# Patient Record
Sex: Male | Born: 1945 | Race: White | Hispanic: No | Marital: Married | Smoking: Never smoker
Health system: Southern US, Community
[De-identification: ages and names within clinical notes are randomized; demographics above are authoritative.]

## PROBLEM LIST (undated history)

## (undated) DIAGNOSIS — I10 Essential (primary) hypertension: Secondary | ICD-10-CM

## (undated) HISTORY — PX: CARDIAC SURGERY: SHX584

## (undated) HISTORY — PX: EYE SURGERY: SHX253

## (undated) HISTORY — PX: CATARACT EXTRACTION: SUR2

## (undated) HISTORY — DX: Essential (primary) hypertension: I10

---

## 2013-12-23 DIAGNOSIS — C44211 Basal cell carcinoma of skin of unspecified ear and external auricular canal: Secondary | ICD-10-CM | POA: Insufficient documentation

## 2015-04-11 DIAGNOSIS — Z951 Presence of aortocoronary bypass graft: Secondary | ICD-10-CM | POA: Insufficient documentation

## 2015-04-11 DIAGNOSIS — I10 Essential (primary) hypertension: Secondary | ICD-10-CM | POA: Insufficient documentation

## 2015-04-11 DIAGNOSIS — Z87891 Personal history of nicotine dependence: Secondary | ICD-10-CM | POA: Insufficient documentation

## 2015-04-11 DIAGNOSIS — E7800 Pure hypercholesterolemia, unspecified: Secondary | ICD-10-CM | POA: Insufficient documentation

## 2015-04-11 DIAGNOSIS — Z7982 Long term (current) use of aspirin: Secondary | ICD-10-CM | POA: Insufficient documentation

## 2015-04-11 DIAGNOSIS — I2510 Atherosclerotic heart disease of native coronary artery without angina pectoris: Secondary | ICD-10-CM | POA: Insufficient documentation

## 2015-10-19 DIAGNOSIS — Z1211 Encounter for screening for malignant neoplasm of colon: Secondary | ICD-10-CM | POA: Insufficient documentation

## 2016-11-17 DIAGNOSIS — G4733 Obstructive sleep apnea (adult) (pediatric): Secondary | ICD-10-CM | POA: Insufficient documentation

## 2018-12-08 ENCOUNTER — Other Ambulatory Visit: Payer: Self-pay | Admitting: Neurological Surgery

## 2018-12-08 DIAGNOSIS — R29898 Other symptoms and signs involving the musculoskeletal system: Secondary | ICD-10-CM

## 2018-12-20 ENCOUNTER — Other Ambulatory Visit: Payer: Self-pay | Admitting: Neurological Surgery

## 2018-12-20 DIAGNOSIS — T1590XA Foreign body on external eye, part unspecified, unspecified eye, initial encounter: Secondary | ICD-10-CM

## 2019-01-10 ENCOUNTER — Other Ambulatory Visit: Payer: Self-pay

## 2019-02-24 DIAGNOSIS — I25118 Atherosclerotic heart disease of native coronary artery with other forms of angina pectoris: Secondary | ICD-10-CM | POA: Insufficient documentation

## 2019-02-24 DIAGNOSIS — Z87891 Personal history of nicotine dependence: Secondary | ICD-10-CM | POA: Insufficient documentation

## 2019-02-24 DIAGNOSIS — R079 Chest pain, unspecified: Secondary | ICD-10-CM | POA: Insufficient documentation

## 2019-03-01 ENCOUNTER — Other Ambulatory Visit: Payer: Self-pay

## 2019-03-01 ENCOUNTER — Ambulatory Visit
Admission: RE | Admit: 2019-03-01 | Discharge: 2019-03-01 | Disposition: A | Discharge status: Home / Self Care | Payer: Medicare Other | Source: Ambulatory Visit | Attending: Neurological Surgery | Admitting: Neurological Surgery

## 2019-03-01 DIAGNOSIS — T1590XA Foreign body on external eye, part unspecified, unspecified eye, initial encounter: Secondary | ICD-10-CM

## 2019-03-01 DIAGNOSIS — R29898 Other symptoms and signs involving the musculoskeletal system: Secondary | ICD-10-CM

## 2019-03-28 ENCOUNTER — Other Ambulatory Visit: Payer: Self-pay | Admitting: Neurological Surgery

## 2019-06-16 ENCOUNTER — Other Ambulatory Visit: Payer: Self-pay

## 2019-06-16 DIAGNOSIS — Z20822 Contact with and (suspected) exposure to covid-19: Secondary | ICD-10-CM

## 2019-06-17 LAB — NOVEL CORONAVIRUS, NAA: SARS-CoV-2, NAA: DETECTED — AB

## 2019-06-20 ENCOUNTER — Emergency Department (HOSPITAL_COMMUNITY): Payer: Medicare Other

## 2019-06-20 ENCOUNTER — Emergency Department (HOSPITAL_COMMUNITY)
Admission: EM | Admit: 2019-06-20 | Discharge: 2019-06-20 | Disposition: A | Discharge status: Home / Self Care | Payer: Medicare Other | Attending: Emergency Medicine | Admitting: Emergency Medicine

## 2019-06-20 ENCOUNTER — Other Ambulatory Visit: Payer: Self-pay

## 2019-06-20 DIAGNOSIS — R251 Tremor, unspecified: Secondary | ICD-10-CM | POA: Diagnosis not present

## 2019-06-20 DIAGNOSIS — R2681 Unsteadiness on feet: Secondary | ICD-10-CM | POA: Insufficient documentation

## 2019-06-20 DIAGNOSIS — I2510 Atherosclerotic heart disease of native coronary artery without angina pectoris: Secondary | ICD-10-CM | POA: Diagnosis not present

## 2019-06-20 DIAGNOSIS — R5383 Other fatigue: Secondary | ICD-10-CM | POA: Diagnosis not present

## 2019-06-20 DIAGNOSIS — U071 COVID-19: Secondary | ICD-10-CM | POA: Insufficient documentation

## 2019-06-20 DIAGNOSIS — R531 Weakness: Secondary | ICD-10-CM | POA: Diagnosis present

## 2019-06-20 DIAGNOSIS — Z951 Presence of aortocoronary bypass graft: Secondary | ICD-10-CM | POA: Diagnosis not present

## 2019-06-20 DIAGNOSIS — R6889 Other general symptoms and signs: Secondary | ICD-10-CM | POA: Insufficient documentation

## 2019-06-20 DIAGNOSIS — Z79899 Other long term (current) drug therapy: Secondary | ICD-10-CM | POA: Insufficient documentation

## 2019-06-20 LAB — COMPREHENSIVE METABOLIC PANEL
ALT: 22 U/L (ref 0–44)
AST: 30 U/L (ref 15–41)
Albumin: 3.6 g/dL (ref 3.5–5.0)
Alkaline Phosphatase: 62 U/L (ref 38–126)
Anion gap: 11 (ref 5–15)
BUN: 11 mg/dL (ref 8–23)
CO2: 23 mmol/L (ref 22–32)
Calcium: 9.2 mg/dL (ref 8.9–10.3)
Chloride: 105 mmol/L (ref 98–111)
Creatinine, Ser: 0.93 mg/dL (ref 0.61–1.24)
GFR calc Af Amer: >60 mL/min (ref >60)
GFR calc non Af Amer: >60 mL/min (ref >60)
Glucose, Bld: 94 mg/dL (ref 70–99)
Potassium: 4.1 mmol/L (ref 3.5–5.1)
Sodium: 139 mmol/L (ref 135–145)
Total Bilirubin: 1 mg/dL (ref 0.3–1.2)
Total Protein: 6.4 g/dL — ABNORMAL LOW (ref 6.5–8.1)

## 2019-06-20 LAB — URINALYSIS, ROUTINE W REFLEX MICROSCOPIC
Bilirubin Urine: NEGATIVE
Glucose, UA: NEGATIVE mg/dL
Hgb urine dipstick: NEGATIVE
Ketones, ur: NEGATIVE mg/dL
Leukocytes,Ua: NEGATIVE
Nitrite: NEGATIVE
Protein, ur: NEGATIVE mg/dL
Specific Gravity, Urine: 1.01 (ref 1.005–1.030)
pH: 6 (ref 5.0–8.0)

## 2019-06-20 LAB — CBC
HCT: 36.8 % — ABNORMAL LOW (ref 39.0–52.0)
Hemoglobin: 12.3 g/dL — ABNORMAL LOW (ref 13.0–17.0)
MCH: 30.4 pg (ref 26.0–34.0)
MCHC: 33.4 g/dL (ref 30.0–36.0)
MCV: 91.1 fL (ref 80.0–100.0)
Platelets: 267 10*3/uL (ref 150–400)
RBC: 4.04 MIL/uL — ABNORMAL LOW (ref 4.22–5.81)
RDW: 12.1 % (ref 11.5–15.5)
WBC: 4.9 10*3/uL (ref 4.0–10.5)
nRBC: 0 % (ref 0.0–0.2)

## 2019-06-20 LAB — BASIC METABOLIC PANEL
Anion gap: 8 (ref 5–15)
BUN: 9 mg/dL (ref 8–23)
CO2: 25 mmol/L (ref 22–32)
Calcium: 9.1 mg/dL (ref 8.9–10.3)
Chloride: 105 mmol/L (ref 98–111)
Creatinine, Ser: 0.94 mg/dL (ref 0.61–1.24)
GFR calc Af Amer: >60 mL/min (ref >60)
GFR calc non Af Amer: >60 mL/min (ref >60)
Glucose, Bld: 91 mg/dL (ref 70–99)
Potassium: 4 mmol/L (ref 3.5–5.1)
Sodium: 138 mmol/L (ref 135–145)

## 2019-06-20 MED ORDER — SODIUM CHLORIDE 0.9% FLUSH: 3.0000 mL | Freq: Once | INTRAVENOUS | Status: DC

## 2019-06-20 NOTE — ED Provider Notes (Signed)
[facility] EMERGENCY DEPARTMENT Provider Note   CSN: [id number] Arrival date & time: 06/20/19  1348     History   Chief Complaint Chief Complaint  Patient presents with  . Weakness    HPI George Harvey is a 73 y.o. male with history of CAD with CABG, hypertension, hyperlipidemia, obstructive sleep apnea, diverticulosis presents today for 2-year history of fatigue, tremors, difficulty with ambulation and forgetfulness.  Of note patient reports that he tested positive for COVID-19 virus on 06/07/2019, unable to find this result in EMR system.  Patient has another positive COVID-19 test on 06/16/2019.  He reports he has been asymptomatic regarding this virus and he was only tested and screening prior to a primary care provider visit.  Patient reports that over the past few years he has been having generalized weakness, feeling tired, bilateral tremors worse in the upper extremities and increasing forgetfulness.  He denies any acute worsening of the symptoms he reports them all as progressive over the past 2 years.  He reports he has been evaluated multiple times by his primary care provider without identifiable cause, reports that this has been attributed to "getting older".  Patient and family at bedside are concerned for possible dementia.  They report that 2 weeks ago patient could not remember how to put on her shirt.  Additionally patient reports nausea with intermittent nonbloody/nonbilious vomiting over the past 2 years.  They deny history of fever/chills, headache/vision changes, fall/injury, neck pain, back pain, chest pain, abdominal pain, cough/shortness of breath, extremity swelling/color change, dysuria/hematuria or any additional concerns.    HPI  No past medical history on file.  There are no active problems to display for this patient.        Home Medications    Prior to Admission medications   Medication Sig Start Date End Date Taking? Authorizing  Provider  amLODipine (NORVASC) 5 MG tablet Take 5 mg by mouth daily. 04/06/19   [provider]  clopidogrel (PLAVIX) 75 MG tablet Take 75 mg by mouth daily. 04/06/19   [provider]  DULoxetine (CYMBALTA) 30 MG capsule Take 30 mg by mouth daily. 04/06/19   [provider]  lisinopril (ZESTRIL) 2.5 MG tablet Take 2.5 mg by mouth daily. 04/06/19   [provider]  metoprolol succinate (TOPROL-XL) 25 MG 24 hr tablet Take 25 mg by mouth daily. 04/06/19   [provider]  pantoprazole (PROTONIX) 40 MG tablet Take 40 mg by mouth daily. 06/14/19   [provider]  pravastatin (PRAVACHOL) 40 MG tablet Take 40 mg by mouth at bedtime. 05/30/19   [provider]    Family History No family history on file.  Social History Social History   Tobacco Use  . Smoking status: Not on file  Substance Use Topics  . Alcohol use: Not on file  . Drug use: Not on file     Allergies   Patient has no known allergies.   Review of Systems Review of Systems Ten systems are reviewed and are negative for acute change except as noted in the HPI   Physical Exam Updated Vital Signs BP 128/63   Pulse 65 Comment: Simultaneous filing. User may not have seen previous data.  Temp 97.9 F (36.6 C) (Oral)   Resp 19   Ht 5\' 7"  (1.702 m)   Wt 88.5 kg   SpO2 97% Comment: Simultaneous filing. User may not have seen previous data.  BMI 30.54 kg/m   Physical Exam  Constitutional:      General: He is not in acute distress.    Appearance: Normal appearance. He is well-developed. He is not ill-appearing or diaphoretic.  HENT:     Head: Normocephalic and atraumatic.     Right Ear: External ear normal.     Left Ear: External ear normal.     Nose: Nose normal.     Mouth/Throat:     Mouth: Mucous membranes are moist.     Pharynx: Oropharynx is clear.  Eyes:     General: Vision grossly intact. Gaze aligned appropriately.     Pupils: Pupils are equal, round, and  reactive to light.  Neck:     Musculoskeletal: Normal range of motion.     Trachea: Trachea and phonation normal. No tracheal deviation.  Cardiovascular:     Rate and Rhythm: Normal rate and regular rhythm.     Pulses: Normal pulses.     Heart sounds: Normal heart sounds.  Pulmonary:     Effort: Pulmonary effort is normal. No respiratory distress.  Abdominal:     General: There is no distension.     Palpations: Abdomen is soft.     Tenderness: There is no abdominal tenderness. There is no guarding or rebound.  Musculoskeletal: Normal range of motion.  Skin:    General: Skin is warm and dry.  Neurological:     Mental Status: He is alert.     GCS: GCS eye subscore is 4. GCS verbal subscore is 5. GCS motor subscore is 6.     Comments: Speech is clear and goal oriented, follows commands Major Cranial nerves without deficit, no facial droop Normal strength in upper and lower extremities bilaterally including dorsiflexion and plantar flexion, strong and equal grip strength Sensation normal to light and sharp touch Coordination intact, no gross ataxia, tremor noted in all extremities Normal finger to nose intact bilaterally No pronator drift  Psychiatric:        Behavior: Behavior normal.     ED Treatments / Results  Labs (all labs ordered are listed, but only abnormal results are displayed) Labs Reviewed  CBC - Abnormal; Notable for the following components:      Result Value   RBC 4.04 (*)    Hemoglobin 12.3 (*)    HCT 36.8 (*)    All other components within normal limits  COMPREHENSIVE METABOLIC PANEL - Abnormal; Notable for the following components:   Total Protein 6.4 (*)    All other components within normal limits  BASIC METABOLIC PANEL  URINALYSIS, ROUTINE W REFLEX MICROSCOPIC    EKG EKG Interpretation  Date/Time:  Monday June 20 2019 14:00:41 EDT Ventricular Rate:  73 PR Interval:  162 QRS Duration: 96 QT Interval:  396 QTC Calculation: 436 R Axis:    17 Text Interpretation:  Normal sinus rhythm Normal ECG No previous tracing Confirmed by Gwyneth SproutPlunkett, Whitney ([id number]) on 06/20/2019 5:42:10 PM   Radiology Ct Head Wo Contrast  Result Date: 06/20/2019 CLINICAL DATA:  Worsening chronic ataxia and generalized weakness. Extremity shaking. No reported injury. EXAM: CT HEAD WITHOUT CONTRAST TECHNIQUE: Contiguous axial images were obtained from the base of the skull through the vertex without intravenous contrast. COMPARISON:  03/01/2019 brain MRI. FINDINGS: Brain: Tiny bilateral basal ganglia lacunes. No evidence of parenchymal hemorrhage or extra-axial fluid collection. No mass lesion, mass effect, or midline shift. No CT evidence of acute infarction. Generalized cerebral volume loss. Nonspecific prominent subcortical and periventricular white matter hypodensity, most in keeping with chronic small  vessel ischemic change. Cerebral ventricle sizes are concordant with the degree of cerebral volume loss. Vascular: No acute abnormality. Skull: No evidence of calvarial fracture. Sinuses/Orbits: Mucous retention cysts versus polyps in the medial maxillary sinuses bilaterally. No air-fluid levels. Atrophic calcified right globe. Other:  The mastoid air cells are unopacified. IMPRESSION: 1.  No evidence of acute intracranial abnormality. 2. Generalized cerebral volume loss and prominent chronic small vessel ischemic changes in cerebral white matter. Tiny bilateral basal ganglia lacunes. Electronically Signed   By: Delbert Phenix M.D.   On: 06/20/2019 20:41   Dg Chest Portable 1 View  Result Date: 06/20/2019 CLINICAL DATA:  Fatigue. COVID-19 positive. Coronary artery disease. EXAM: PORTABLE CHEST 1 VIEW COMPARISON:  03/18/2019 FINDINGS: The heart size and mediastinal contours are within normal limits. Prior CABG again noted. Both lungs are clear. The visualized skeletal structures are unremarkable. IMPRESSION: No active disease. Electronically Signed   By: Danae Orleans M.D.    On: 06/20/2019 18:10    Procedures Procedures (including critical care time)  Medications Ordered in ED Medications  sodium chloride flush (NS) 0.9 % injection 3 mL (has no administration in time range)     Initial Impression / Assessment and Plan / ED Course  I have reviewed the triage vital signs and the nursing notes.  Pertinent labs & imaging results that were available during my care of the patient were reviewed by me and considered in my medical decision making (see chart for details).    73 year old male with gradual worsening of fatigue, tremor, gait instability and forgetfulness over the past 2 years.  Appears somewhat worsened since his diagnosis of COVID-19 virus 2 weeks ago.  On initial evaluation he is overall well-appearing, resting comfortably and in no acute distress.  Heart regular rate and rhythm without murmur, lungs clear to auscultation bilaterally, abdomen soft nontender without peritoneal signs, Norvasc intact to all 4 extremities without sign of DVT, neuro exam without focal deficit he does have tremor present in all 4 extremities.  Discussed case with Dr. Tanna Savoy will obtain basic blood work/urinalysis, chest x-ray, EKG and CT head. - CBC nonacute BMP within normal limits CMP nonacute Urinalysis within normal limits Chest x-ray:  IMPRESSION:  No active disease.  - On reevaluation patient resting comfortably and in no acute distress.  Patient and family member are agreeable to CT scan and work-up today. - EKG: Normal sinus rhythm Normal ECG No previous tracing Confirmed by Gwyneth Sprout ([id number]) on 06/20/2019 5:42:10 PM  CT Head:  IMPRESSION:  1. No evidence of acute intracranial abnormality.  2. Generalized cerebral volume loss and prominent chronic small  vessel ischemic changes in cerebral white matter. Tiny bilateral  basal ganglia lacunes.  - Patient seen and evaluated by Dr. Tanna Savoy, advises no further work-up here in the ED patient to be  given ambulatory referral to outpatient neurology for further work-up. - Patient reassessed lying comfortably in bed no acute distress, states understanding care plan is agreeable to discharge with neurology referral.  At this time there does not appear to be any evidence of an acute emergency medical condition and the patient appears stable for discharge with appropriate outpatient follow up. Diagnosis was discussed with patient and family who are understanding of care plan and is agreeable to discharge.  Return precautions given to patient and family member who state understanding of return precautions. Patient encouraged to follow-up with their PCP and neurologist. All questions answered.  Adolphus Birchwood was evaluated in Emergency Department on  06/20/2019 for the symptoms described in the history of present illness. He was evaluated in the context of the global COVID-19 pandemic, which necessitated consideration that the patient might be at risk for infection with the SARS-CoV-2 virus that causes COVID-19. Institutional protocols and algorithms that pertain to the evaluation of patients at risk for COVID-19 are in a state of rapid change based on information released by regulatory bodies including the CDC and federal and state organizations. These policies and algorithms were followed during the patient's care in the ED.  Note: Portions of this report may have been transcribed using voice recognition software. Every effort was made to ensure accuracy; however, inadvertent computerized transcription errors may still be present. Final Clinical Impressions(s) / ED Diagnoses   Final diagnoses:  Tremor, unspecified  Forgetfulness  Fatigue, unspecified type  Gait instability  COVID-19 virus infection    ED Discharge Orders         Ordered    Ambulatory referral to Neurology    Comments: An appointment is requested in approximately: 1 week   06/20/19 2119           Elizabeth PalauMorelli, Kellie Murrill A, PA-C  06/20/19 2137    Gwyneth SproutPlunkett, Whitney, MD 06/20/19 2244

## 2019-06-20 NOTE — ED Notes (Signed)
Attempted to obtain urine specimen; Pt unable to provide one at this time 

## 2019-06-20 NOTE — ED Triage Notes (Signed)
Pt reports generalized weakness and balance issues X2 years. Also c/o shakiness in all extremities. Seen by PCP for the same with no answers.

## 2019-06-20 NOTE — Discharge Instructions (Addendum)
You have been diagnosed today with gait instability, fatigue, forgetfulness, tremor.  At this time there does not appear to be the presence of an emergent medical condition, however there is always the potential for conditions to change. Please read and follow the below instructions.  Please return to the Emergency Department immediately for any new or worsening symptoms. Please be sure to follow up with your Primary Care Provider within one week regarding your visit today; please call their office to schedule an appointment even if you are feeling better for a follow-up visit. You should be contacted within the next several days to schedule an appointment with the [facility] neurology office for further evaluation of your symptoms.  If you do not hear from their office within the next few days you may call the number on your discharge paperwork to schedule this appointment. Please be sure to use your walker at home to avoid falls. Please continue your self quarantine to avoid spread of the COVID-19 virus.  Follow-up with a primary care provider this week for tele-visit and reevaluation.  Return to emerge department immediately for any new or worsening symptoms.  Get help right away if: You feel confused. You fall or suffer any type of injury You have chest pain or difficulty breathing Your vision is blurry. You feel faint or you pass out. You have a severe headache. You have severe pain in your abdomen, your back, or the area between your waist and hips (pelvis). You have chest pain, shortness of breath, or an irregular or fast heartbeat. You are unable to urinate, or you urinate less than normal. You have abnormal bleeding, such as bleeding from the rectum, vagina, nose, lungs, or nipples. You vomit. You have any new/concerning or worsening symptoms.  Please read the additional information packets attached to your discharge summary.  Do not take your medicine if  develop an itchy rash,  swelling in your mouth or lips, or difficulty breathing; call 911 and seek immediate emergency medical attention if this occurs.

## 2019-06-30 ENCOUNTER — Encounter: Payer: Self-pay | Admitting: Neurology

## 2019-07-25 NOTE — Progress Notes (Addendum)
NEUROLOGY CONSULTATION NOTE  George Harvey MRN: [id number] DOB: September 24, 1946  Referring provider: Nuala Alpha, PA-C (ED referral) Primary care provider: Fredrich Romans, MD  Reason for consult:  Drop attacks, ataxia.  HISTORY OF PRESENT ILLNESS: George Harvey is a 73 year old male who presents for weakness and drop attacks.  He is accompanied by his daughter who supplements history.  He started having trouble with falls about 49 to 73 years old.  It has been occurring off and on since, but has progressed.  At first, his legs would just suddenly give out.  It takes him a few minutes to stand up and when he stands up his upper body is extended back to maintain balance and resolves in 15 to 30 minutes.  No loss of consciousness.  He has had about 3 drop attacks this past year.  He also reports balance issues that has progressed.  He veers off to either side. He also reports longstanding history of dizziness or lightheadedness associated with nausea.  They can last 1 to 2 days.  No associated headache.  They occur once a month.  Rooms with high ceilings or bright lights (such as at [facility]) will make him feel dizzy.  Since childhood, he has always been to movement (such as riding a carnival ride or sitting in back of car).  No history of headaches.  He may have intermittent double vision. He presented to the ED in September with episode of acute tremors lasting 2 hours which subsequently resolved.  No recurrent episodes.  He reports history of very mild tremor in his hands, particularly when holding an object.  MRI of brain without contrast from 03/01/2019 personally showed atrophy and diffuse chronic small vessel ischemic changes in the cerebral white matter as well as scattered remote lacunar infarcts, including the cerebellum.  MRI of cervical spine from 06/28/2019 showed multilevel cervical spondylosis with severe bilateral neural foraminal stenosis at C5-6 and C6-7 with no significant spinal  stenosis.  MRI lumbar spine showed mild multilevel lumbar spondylosis with mild canal stenosis and moderate left and mild right lateral recess stenosis at L3-4 and L4-5.  LABS:  WBC 5.9, HGB 14.1, HCT 40.3, PLT 272, Na 141, K 4.4, BUN 11, Cr 1.01, GFR 72, glucose 94, Hgb A1c 5.7, ALT 15.  PAST MEDICAL HISTORY: Past Medical History:  Diagnosis Date  . Hypertension     PAST SURGICAL HISTORY: Past Surgical History:  Procedure Laterality Date  . CARDIAC SURGERY     stents place  . CATARACT EXTRACTION    . EYE SURGERY Right      MEDICATIONS: Current Outpatient Medications on File Prior to Visit  Medication Sig Dispense Refill  . amLODipine (NORVASC) 5 MG tablet Take 5 mg by mouth daily.    . clopidogrel (PLAVIX) 75 MG tablet Take 75 mg by mouth daily.    . DULoxetine (CYMBALTA) 30 MG capsule Take 30 mg by mouth daily.    Marland Kitchen lisinopril (ZESTRIL) 2.5 MG tablet Take 2.5 mg by mouth daily.    . metoprolol succinate (TOPROL-XL) 25 MG 24 hr tablet Take 25 mg by mouth daily.    . pantoprazole (PROTONIX) 40 MG tablet Take 40 mg by mouth daily.    . pravastatin (PRAVACHOL) 40 MG tablet Take 40 mg by mouth at bedtime.     No current facility-administered medications on file prior to visit.     ALLERGIES: No Known Allergies  FAMILY HISTORY: Family History  Problem Relation Age of Onset  .  Heart Problems Mother   . Heart attack Father   . Heart attack Brother        deceased  . Stroke Brother   . Heart Problems Brother   . Hypertension Child        x2  . Healthy Child        x2    SOCIAL HISTORY: Social History   Socioeconomic History  . Marital status: Married    Spouse name: Not on file  . Number of children: Not on file  . Years of education: Not on file  . Highest education level: Not on file  Occupational History  . Not on file  Social Needs  . Financial resource strain: Not on file  . Food insecurity    Worry: Not on file    Inability: Not on file  .  Transportation needs    Medical: Not on file    Non-medical: Not on file  Tobacco Use  . Smoking status: Not on file  Substance and Sexual Activity  . Alcohol use: Not on file  . Drug use: Not on file  . Sexual activity: Not on file  Lifestyle  . Physical activity    Days per week: Not on file    Minutes per session: Not on file  . Stress: Not on file  Relationships  . Social Musician on phone: Not on file    Gets together: Not on file    Attends religious service: Not on file    Active member of club or organization: Not on file    Attends meetings of clubs or organizations: Not on file    Relationship status: Not on file  . Intimate partner violence    Fear of current or ex partner: Not on file    Emotionally abused: Not on file    Physically abused: Not on file    Forced sexual activity: Not on file  Other Topics Concern  . Not on file  Social History Narrative  . Not on file    REVIEW OF SYSTEMS: Constitutional: No fevers, chills, or sweats, no generalized fatigue, change in appetite Eyes: vision loss in right eye Ear, nose and throat: No hearing loss, ear pain, nasal congestion, sore throat Cardiovascular: No chest pain, palpitations Respiratory:  No shortness of breath at rest or with exertion, wheezes GastrointestinaI: No nausea, vomiting, diarrhea, abdominal pain, fecal incontinence Genitourinary:  No dysuria, urinary retention or frequency Musculoskeletal:  No neck pain, back pain Integumentary: No rash, pruritus, skin lesions Neurological: as above Psychiatric: No depression, insomnia, anxiety Endocrine: No palpitations, fatigue, diaphoresis, mood swings, change in appetite, change in weight, increased thirst Hematologic/Lymphatic:  No purpura, petechiae. Allergic/Immunologic: no itchy/runny eyes, nasal congestion, recent allergic reactions, rashes  PHYSICAL EXAM: Blood pressure 124/77, pulse 85, height 5\' 7"  (1.702 m), weight 179 lb 9.6 oz  (81.5 kg), SpO2 94 %. General: No acute distress.  Patient appears well-groomed.   Head:  Normocephalic/atraumatic Eyes:  fundi examined but not visualized Neck: supple, no paraspinal tenderness, full range of motion Back: No paraspinal tenderness Heart: regular rate and rhythm Lungs: Clear to auscultation bilaterally. Vascular: No carotid bruits. Neurological Exam: Mental status: alert and oriented to person, place, and time, recent and remote memory intact, fund of knowledge intact, attention and concentration intact, speech fluent and not dysarthric, language intact. Cranial nerves: CN I: not tested CN II: Scarring of right eye; left pupil round and reactive to light, Vision loss in right  eye. CN III, IV, VI:  full range of motion, no nystagmus, no ptosis CN V: facial sensation intact CN VII: upper and lower face symmetric CN VIII: hearing intact CN IX, X: gag intact, uvula midline CN XI: sternocleidomastoid and trapezius muscles intact CN XII: tongue midline Bulk & Tone: normal, no fasciculations. Motor:  5/5 throughout  Sensation:  Pinprick sensation intact; vibration sensation reduced in toes. Deep Tendon Reflexes:  2+ throughout, toes downgoing.   Finger to nose testing:  Without dysmetria.   Heel to shin:  Without dysmetria.   Gait:  Mildly wide-based gait.  Able to turn and tandem walk.  Romberg negative.  IMPRESSION: 1.  Drop attacks 2.  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  Disequilibrium, chronic 4.  Cerebrovascular disease. 5.  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  Usually mild.  I am uncertain why he had a severe episode of ongoing tremors.  Disequilibrium may be multifactorial.  He has longstanding history of motion sickness since childhood.  He has cerebrovascular disease, including involvement of the cerebellum, which may be playing a role in balance difficulty.  Cerebrovascular disease may be affecting cognition as well.  Unclear etiology of drop  attacks but I would like to rule out vertebrobasilar insufficiency and epileptic drop attacks.    PLAN: 1.  To further evaluate drop attacks, will check CTA of head (to evaluate for vertebrobasilar insufficiency) and EEG (to evaluate for epileptic etiology) 2.  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 3.  Follow up in 4 months.  Thank you for allowing me to take part in the care of this patient.  Shon MilletAdam Jaffe, DO  CC:  Malachi ParadiseHarry Alexander, MD

## 2019-07-28 ENCOUNTER — Other Ambulatory Visit: Payer: Self-pay

## 2019-07-28 ENCOUNTER — Ambulatory Visit: Payer: Medicare Other | Admitting: Neurology

## 2019-07-28 ENCOUNTER — Encounter: Payer: Self-pay | Admitting: Neurology

## 2019-07-28 VITALS — BP 124/77 | HR 85 | Ht 67.0 in | Wt 179.6 lb

## 2019-07-28 DIAGNOSIS — G43809 Other migraine, not intractable, without status migrainosus: Secondary | ICD-10-CM | POA: Diagnosis not present

## 2019-07-28 DIAGNOSIS — R55 Syncope and collapse: Secondary | ICD-10-CM | POA: Diagnosis not present

## 2019-07-28 DIAGNOSIS — E878 Other disorders of electrolyte and fluid balance, not elsewhere classified: Secondary | ICD-10-CM

## 2019-07-28 DIAGNOSIS — I679 Cerebrovascular disease, unspecified: Secondary | ICD-10-CM

## 2019-07-28 MED ORDER — AIMOVIG 70 MG/ML ~~LOC~~ SOAJ: 70.0000 mg | SUBCUTANEOUS | 0 refills | Status: DC

## 2019-07-28 MED ORDER — AIMOVIG 70 MG/ML ~~LOC~~ SOAJ: 70.0000 mg | SUBCUTANEOUS | 11 refills | Status: DC

## 2019-07-28 NOTE — Patient Instructions (Signed)
1.  Start Aimovig 70mg  injection every 30 days 2.  Check CTA of head 3.  Check EEG 4.  Follow up in 4 months

## 2019-07-29 ENCOUNTER — Encounter: Payer: Self-pay | Admitting: *Deleted

## 2019-07-29 NOTE — Progress Notes (Signed)
George Harvey (Key: [id number])  Your information has been submitted to [place]. [employer] [place] will review the request and notify you of the determination decision directly, typically within 3 business days of your submission and once all necessary information is received.  You will also receive your request decision electronically. To check for an update later, open the request again from your dashboard.  If [employer] [place] has not responded within the specified timeframe or if you have any questions about your PA submission, contact [facility] [place] directly at [facility]) [phone] or ([place]) [phone].

## 2019-07-29 NOTE — Progress Notes (Signed)
Tahje Elison (Key: [id number]) Rx #: [id number] Aimovig 70MG /ML auto-injectors   Form [employer] [place] [employer] Part D General Authorization Form Created 4 hours ago Sent to Plan 1 hour ago Plan Response 1 hour ago Submit Clinical Questions 1 hour ago Determination Favorable 1 minute ago Message from Plan Effective from 07/29/2019 through 07/28/2020.

## 2019-08-01 ENCOUNTER — Other Ambulatory Visit: Payer: Self-pay

## 2019-08-01 ENCOUNTER — Ambulatory Visit (INDEPENDENT_AMBULATORY_CARE_PROVIDER_SITE_OTHER): Payer: Medicare Other | Admitting: Neurology

## 2019-08-01 DIAGNOSIS — G43809 Other migraine, not intractable, without status migrainosus: Secondary | ICD-10-CM

## 2019-08-01 DIAGNOSIS — E878 Other disorders of electrolyte and fluid balance, not elsewhere classified: Secondary | ICD-10-CM

## 2019-08-01 DIAGNOSIS — R55 Syncope and collapse: Secondary | ICD-10-CM | POA: Diagnosis not present

## 2019-08-01 DIAGNOSIS — I679 Cerebrovascular disease, unspecified: Secondary | ICD-10-CM

## 2019-08-02 ENCOUNTER — Telehealth: Payer: Self-pay

## 2019-08-02 NOTE — Telephone Encounter (Signed)
Called spoke with patient he was informed of results. 

## 2019-08-02 NOTE — Procedures (Signed)
ELECTROENCEPHALOGRAM REPORT  Date of Study: 08/01/2019  Patient's Name: George Harvey MRN: [id number] Date of Birth: 1946/04/09  Referring Provider: Metta Clines, DO  Clinical History: 73 year old male with disequilibrium, episodic dizziness and drop attacks.  Medications: NORVASC 5 MG tablet PLAVIX 75 MG tablet CYMBALTA 30 MG capsule ZESTRIL 2.5 MG tablet TOPROL-XL 25 MG 24 hr tablet PROTONIX 40 MG tablet PRAVACHOL 40 MG tablet  Technical Summary: A multichannel digital EEG recording measured by the international 10-20 system with electrodes applied with paste and impedances below 5000 ohms performed in our laboratory with EKG monitoring in an awake and drowsy patient.  Hyperventilation not performed as patient wearing mask due to COVID-19 pandemic.  Photic stimulation was performed.  The digital EEG was referentially recorded, reformatted, and digitally filtered in a variety of bipolar and referential montages for optimal display.    Description: The patient is awake and drowsy during the recording.  During maximal wakefulness, there is a symmetric, medium voltage 10 Hz posterior dominant rhythm that attenuates with eye opening.  The record is symmetric.  There is extensive muscle artifact throughout the study.  During drowsiness, there is an increase in theta slowing of the background.  Stage 2 sleep was not seen.  Photic stimulation did not elicit any abnormalities.  There were no epileptiform discharges or electrographic seizures seen.    EKG lead was unremarkable.  Impression: This awake and drowsy EEG is somewhat limited due to muscle artifact.  However, no epileptiform discharges were seen.  Overall, study is within normal limits..    Clinical Correlation: A normal EEG does not exclude a clinical diagnosis of epilepsy.  If further clinical questions remain, prolonged EEG may be helpful.  Clinical correlation is advised.   Metta Clines, DO

## 2019-08-02 NOTE — Telephone Encounter (Signed)
-----   Message from Pieter Partridge, DO sent at 08/02/2019  8:00 AM EDT ----- EEG is normal

## 2019-08-31 ENCOUNTER — Ambulatory Visit
Admission: RE | Admit: 2019-08-31 | Discharge: 2019-08-31 | Disposition: A | Discharge status: Home / Self Care | Payer: Medicare Other | Source: Ambulatory Visit | Attending: Neurology | Admitting: Neurology

## 2019-08-31 DIAGNOSIS — R55 Syncope and collapse: Secondary | ICD-10-CM

## 2019-08-31 DIAGNOSIS — G43809 Other migraine, not intractable, without status migrainosus: Secondary | ICD-10-CM

## 2019-08-31 DIAGNOSIS — E878 Other disorders of electrolyte and fluid balance, not elsewhere classified: Secondary | ICD-10-CM

## 2019-08-31 DIAGNOSIS — I679 Cerebrovascular disease, unspecified: Secondary | ICD-10-CM

## 2019-08-31 MED ORDER — IOPAMIDOL (ISOVUE-370) INJECTION 76%
75.0000 mL | Freq: Once | INTRAVENOUS | Status: AC | PRN
  Administered 2019-08-31: 75 mL via INTRAVENOUS

## 2019-09-05 ENCOUNTER — Telehealth: Payer: Self-pay

## 2019-09-05 NOTE — Telephone Encounter (Signed)
Called spoke with patient he was informed of results and understands provider response.

## 2019-09-05 NOTE — Telephone Encounter (Signed)
-----   Message from Pieter Partridge, DO sent at 09/05/2019  9:04 AM EST ----- Blood vessels in head show plaque build up in the right vertebral artery however the other blood vessels are overall okay and do not explain the drop attacks.  For this finding, we recommend continuing plavix as well as management of cholesterol.  At this point, I have no explanation for drop attacks.

## 2019-09-27 ENCOUNTER — Telehealth: Payer: Self-pay | Admitting: Neurology

## 2019-09-27 NOTE — Telephone Encounter (Signed)
No answer at 231  

## 2019-09-27 NOTE — Telephone Encounter (Signed)
Patients wife called regarding a Migraine medication  Co pay card  that was given for prescriptions and there was no paperwork given with it so they could receive a discount for the medication. Please Call. Thank you

## 2019-12-04 NOTE — Progress Notes (Signed)
NEUROLOGY FOLLOW UP OFFICE NOTE  CAM DAUPHIN [id number]  HISTORY OF PRESENT ILLNESS: George Harvey is a 74 year old male who follows up for drop attacks and dizziness.  UPDATE: 06/20/2019 CT Head Wo:  Generalized cerebral volume loss and prominent chronic small vessel ischemic changes but no acute intracranial abnormality. 08/01/2019 EEG:  Normal awake and drowsy 08/31/2019 CTA of Head W Wo:  Intracranial atherosclerotic disease with moderate stenosis within intracranial right vertebral artery, mild-moderate stenosis right ICA but no significant stenosis including patent basilar artery.  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  He hasn't had an attack since before his last appointment with me.  However, he may go several months before attacks.  But overall dizziness has improved as well.  No falls.  HISTORY: He started having trouble with falls about 10 to 12 years ago.  It has been occurring off and on since, but has progressed.  At first, his legs would just suddenly give out.  It takes him a few minutes to stand up and when he stands up his upper body is extended back to maintain balance and resolves in 15 to 30 minutes.  No loss of consciousness.  He has had about 3 drop attacks this past year.  He also reports balance issues that has progressed.  He veers off to either side. He also reports longstanding history of dizziness or lightheadedness associated with nausea.  They can last 1 to 2 days.  No associated headache.  They occur once a month.  Rooms with high ceilings or bright lights (such as at [facility]) will make him feel dizzy.  Since childhood, he has always been to movement (such as riding a carnival ride or sitting in back of car).  No history of headaches.  He may have intermittent double vision. He presented to the ED in September with episode of acute tremors lasting 2 hours which subsequently resolved.  No recurrent episodes.  He reports  history of very mild tremor in his hands, particularly when holding an object.  MRI of brain without contrast from 03/01/2019 personally showed atrophy and diffuse chronic small vessel ischemic changes in the cerebral white matter as well as scattered remote lacunar infarcts, including the cerebellum.  MRI of cervical spine from 06/28/2019 showed multilevel cervical spondylosis with severe bilateral neural foraminal stenosis at C5-6 and C6-7 with no significant spinal stenosis.  MRI lumbar spine showed mild multilevel lumbar spondylosis with mild canal stenosis and moderate left and mild right lateral recess stenosis at L3-4 and L4-5.  Past Medical History: Past Medical History:  Diagnosis Date  . Hypertension     Medications: Outpatient Encounter Medications as of 12/05/2019  Medication Sig  . amLODipine (NORVASC) 5 MG tablet Take 5 mg by mouth daily.  Marland Kitchen buPROPion (WELLBUTRIN XL) 300 MG 24 hr tablet Take by mouth.  . Cholecalciferol (VITAMIN D3) 25 MCG (1000 UT) CAPS Take by mouth.  . clopidogrel (PLAVIX) 75 MG tablet Take 75 mg by mouth daily.  . cyanocobalamin 1000 MCG tablet Take 1,000 mcg by mouth daily.  . DULoxetine (CYMBALTA) 30 MG capsule Take 30 mg by mouth daily.  Eduard Roux (AIMOVIG) 70 MG/ML SOAJ Inject 70 mg into the skin every 30 (thirty) days.  Eduard Roux (AIMOVIG) 70 MG/ML SOAJ Inject 70 mg into the skin every 30 (thirty) days.  Marland Kitchen lisinopril (ZESTRIL) 2.5 MG tablet Take 2.5 mg by mouth daily.  . metoCLOPramide (REGLAN) 5 MG tablet Take  by mouth.  . metoprolol succinate (TOPROL-XL) 25 MG 24 hr tablet Take 25 mg by mouth daily.  . pantoprazole (PROTONIX) 40 MG tablet Take 40 mg by mouth daily.  . pravastatin (PRAVACHOL) 40 MG tablet Take 40 mg by mouth at bedtime.   No facility-administered encounter medications on file as of 12/05/2019.    Allergies: No Known Allergies  Family History: Family History  Problem Relation Age of Onset  . Heart Problems Mother     . Heart attack Father   . Heart attack Brother        deceased  . Stroke Brother   . Heart Problems Brother   . Hypertension Child        x2  . Healthy Child        x2    Social History: Social History   Socioeconomic History  . Marital status: Married    Spouse name: Not on file  . Number of children: 4  . Years of education: Not on file  . Highest education level: 10th grade  Occupational History  . Not on file  Tobacco Use  . Smoking status: Never Smoker  . Smokeless tobacco: Never Used  Substance and Sexual Activity  . Alcohol use: Never  . Drug use: Never  . Sexual activity: Not on file  Other Topics Concern  . Not on file  Social History Narrative   Pt lives with spouse in 1 story home, he has 4 children   Right handed, he does not drink coffee, tea, or soda.   He does not exercise regularly but does walk daily.   Social Determinants of Health   Financial Resource Strain:   . Difficulty of Paying Living Expenses: Not on file  Food Insecurity:   . Worried About Programme researcher, broadcasting/film/video in the Last Year: Not on file  . Ran Out of Food in the Last Year: Not on file  Transportation Needs:   . Lack of Transportation (Medical): Not on file  . Lack of Transportation (Non-Medical): Not on file  Physical Activity:   . Days of Exercise per Week: Not on file  . Minutes of Exercise per Session: Not on file  Stress:   . Feeling of Stress : Not on file  Social Connections:   . Frequency of Communication with Friends and Family: Not on file  . Frequency of Social Gatherings with Friends and Family: Not on file  . Attends Religious Services: Not on file  . Active Member of Clubs or Organizations: Not on file  . Attends Banker Meetings: Not on file  . Marital Status: Not on file  Intimate Partner Violence:   . Fear of Current or Ex-Partner: Not on file  . Emotionally Abused: Not on file  . Physically Abused: Not on file  . Sexually Abused: Not on file     Observations/Objective:   Vitals:   12/05/19 1552  BP: 120/70  Pulse: 67  Temp: 98.6 F (37 C)  SpO2: 96%  alert and oriented to person, place, and time. Attention span and concentration intact, recent and remote memory intact, fund of knowledge intact.  Speech fluent and not dysarthric, language intact.  Scarring and vision loss of right eye.  Otherwise, CN II-XII intact. Bulk and tone normal, muscle strength 5/5 throughout.  Sensation to light touch  intact.  Deep tendon reflexes 2+ throughout.  Finger to nose testing intact.  Gait normal, Romberg negative.  Assessment and Plan:   1.  Drop attacks 2.  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  Chronic disequilibrium.  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  1.  Aimovig 70mg  monthly 2.  Follow up in 6 months.    , DO

## 2019-12-05 ENCOUNTER — Encounter: Payer: Self-pay | Admitting: Neurology

## 2019-12-05 ENCOUNTER — Ambulatory Visit: Payer: Medicare Other | Admitting: Neurology

## 2019-12-05 ENCOUNTER — Other Ambulatory Visit: Payer: Self-pay

## 2019-12-05 VITALS — BP 120/70 | HR 67 | Temp 98.6°F | Ht 66.0 in | Wt 184.0 lb

## 2019-12-05 DIAGNOSIS — R55 Syncope and collapse: Secondary | ICD-10-CM

## 2019-12-05 DIAGNOSIS — G43809 Other migraine, not intractable, without status migrainosus: Secondary | ICD-10-CM

## 2019-12-05 NOTE — Patient Instructions (Addendum)
Continue Aimovig shot every 30 days Follow up in 6 months.

## 2020-02-10 DIAGNOSIS — R0609 Other forms of dyspnea: Secondary | ICD-10-CM | POA: Insufficient documentation

## 2020-04-21 DIAGNOSIS — I214 Non-ST elevation (NSTEMI) myocardial infarction: Secondary | ICD-10-CM | POA: Insufficient documentation

## 2020-04-21 DIAGNOSIS — I255 Ischemic cardiomyopathy: Secondary | ICD-10-CM | POA: Insufficient documentation

## 2020-04-21 DIAGNOSIS — R7989 Other specified abnormal findings of blood chemistry: Secondary | ICD-10-CM | POA: Insufficient documentation

## 2020-05-17 ENCOUNTER — Telehealth: Payer: Self-pay | Admitting: Neurology

## 2020-05-17 NOTE — Telephone Encounter (Signed)
Patient called in stating the patient woke up this morning and his legs wouldn't work right and would have fallen if it wasn't for the wall being there. He stated his legs were heavy a few hours ago, like he couldn't pick them up.

## 2020-05-17 NOTE — Telephone Encounter (Signed)
Pt states he woke up this AM and lost his balance on the way to the bathroom, legs heavy and un-coordinated, functioning at about 60%. Does have a slight headache today. States this is similar to previous episodes except he hasn't completely lost control of his legs. States his legs are better now, functioning at about 80%. Told him I'd let Dr Everlena Cooper know and will call him back with any updates.

## 2020-05-17 NOTE — Telephone Encounter (Signed)
Called and let pt know that Dr Everlena Cooper thinks it's probably a migraine, cont to monitor and call if further complications, verbalized understanding.

## 2020-05-17 NOTE — Telephone Encounter (Signed)
Noted.  Likely one of his migraines.  Continue to monitor.

## 2020-05-31 NOTE — Progress Notes (Signed)
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  UPDATE: He is taking Aimovig 70mg  monthly.  He also takes Cymbalta and Reglan. He has had the vertigo with nausea about 10 days out of the past month.  Occasionally he felt like he would pass out but hasn't actually passed out.    HISTORY: He started having trouble with falls about 10 to 12 years ago. It has been occurring off and on since, but has progressed. At first, his legs would just suddenly give out. It takes him a few minutes to stand up and when he stands up his upper body is extended back to maintain balance and resolves in 15 to 30 minutes. No loss of consciousness. He has had about 3 drop attacks this past year. He also reports balance issues that has progressed. He veers off to either side. He also reports longstanding history of dizziness or lightheadedness associated with nausea. They can last 1 to 2 days. No associated headache. They occur once a month. Rooms with high ceilings or bright lights (such as at [facility]) will make him feel dizzy. Since childhood, he has always been to movement (such as riding a carnival ride or sitting in back of car). No history of headaches. He may have intermittent double vision. He presented to the ED in September with episode of acute tremors lasting 2 hours which subsequently resolved. No recurrent episodes. He reports history of very mild tremor in his hands, particularly when holding an object.  MRI of brain without contrast from 03/01/2019 personally showed atrophy and diffuse chronic small vessel ischemic changes in the cerebral white matter as well as scattered remote lacunar infarcts, including the cerebellum.  MRI of cervical spine from 06/28/2019 showed multilevel cervical spondylosis with severe bilateral neural foraminal stenosis at C5-6 and  C6-7 with no significant spinal stenosis. MRI lumbar spine showed mild multilevel lumbar spondylosis with mild canal stenosis and moderate left and mild right lateral recess stenosis at L3-4 and L4-5.  06/20/2019 CT Head Wo:  Generalized cerebral volume loss and prominent chronic small vessel ischemic changes but no acute intracranial abnormality. 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 Hypertension     MEDICATIONS: Current Outpatient Medications on File Prior to Visit  Medication Sig Dispense Refill  . amLODipine (NORVASC) 5 MG tablet Take 5 mg by mouth daily.    09/02/2019 buPROPion (WELLBUTRIN XL) 300 MG 24 hr tablet Take by mouth.    . Cholecalciferol (VITAMIN D3) 25 MCG (1000 UT) CAPS Take by mouth.    . clopidogrel (PLAVIX) 75 MG tablet Take 75 mg by mouth daily.    . cyanocobalamin 1000 MCG tablet Take 1,000 mcg by mouth daily.    . DULoxetine (CYMBALTA) 30 MG capsule Take 30 mg by mouth daily.    Marland Kitchen (AIMOVIG) 70 MG/ML SOAJ Inject 70 mg into the skin every 30 (thirty) days. 1 pen 0  . lisinopril (ZESTRIL) 2.5 MG tablet Take 2.5 mg by mouth daily.    . metoCLOPramide (REGLAN) 5 MG tablet Take by mouth.    . metoprolol succinate (TOPROL-XL) 25 MG 24 hr  tablet Take 25 mg by mouth daily.    . pantoprazole (PROTONIX) 40 MG tablet Take 40 mg by mouth daily.    . pravastatin (PRAVACHOL) 40 MG tablet Take 40 mg by mouth at bedtime.     No current facility-administered medications on file prior to visit.    ALLERGIES: No Known Allergies  FAMILY HISTORY: Family History  Problem Relation Age of Onset  . Heart Problems Mother   . Heart attack Father   . Heart attack Brother        deceased    . Stroke Brother   . Heart Problems Brother   . Hypertension Child        x2  . Healthy Child        x2    SOCIAL HISTORY: Social History   Socioeconomic History  . Marital status: Married    Spouse name: Not on file  . Number of children: 4  . Years of education: Not on file  . Highest education level: 10th grade  Occupational History  . Not on file  Tobacco Use  . Smoking status: Never Smoker  . Smokeless tobacco: Never Used  Vaping Use  . Vaping Use: Never used  Substance and Sexual Activity  . Alcohol use: Never  . Drug use: Never  . Sexual activity: Not on file  Other Topics Concern  . Not on file  Social History Narrative   Pt lives with spouse in 1 story home, he has 4 children   Right handed, he does not drink coffee, tea, or soda.   He does not exercise regularly but does walk daily.   Social Determinants of Health   Financial Resource Strain:   . Difficulty of Paying Living Expenses: Not on file  Food Insecurity:   . Worried About Programme researcher, broadcasting/film/video in the Last Year: Not on file  . Ran Out of Food in the Last Year: Not on file  Transportation Needs:   . Lack of Transportation (Medical): Not on file  . Lack of Transportation (Non-Medical): Not on file  Physical Activity:   . Days of Exercise per Week: Not on file  . Minutes of Exercise per Session: Not on file  Stress:   . Feeling of Stress : Not on file  Social Connections:   . Frequency of Communication with Friends and Family: Not on file  . Frequency of Social Gatherings with Friends and Family: Not on file  . Attends Religious Services: Not on file  . Active Member of Clubs or Organizations: Not on file  . Attends Banker Meetings: Not on file  . Marital Status: Not on file  Intimate Partner Violence:   . Fear of Current or Ex-Partner: Not on file  . Emotionally Abused: Not on file  . Physically Abused: Not on file  . Sexually Abused: Not on file     PHYSICAL EXAM: Blood  pressure 126/68, pulse 62, height 5\' 6"  (1.676 m), weight 192 lb 9.6 oz (87.4 kg), SpO2 98 %. General: No acute distress.  Patient appears well-groomed.   Head:  Normocephalic/atraumatic Eyes:  Fundi examined but not visualized Neck: supple, no paraspinal tenderness, full range of motion Heart:  Regular rate and rhythm Lungs:  Clear to auscultation bilaterally Back: No paraspinal tenderness Neurological Exam: alert and oriented to person, place, and time. Attention span and concentration intact, recent and remote memory intact, fund of knowledge intact.  Speech fluent and not dysarthric, language intact.  CN II-XII  intact. Bulk and tone normal, muscle strength 5/5 throughout.  Sensation to light touch, temperature and vibration intact.  Deep tendon reflexes 2+ throughout, toes downgoing.  Finger to nose and heel to shin testing intact.  Gait normal, Romberg negative.  IMPRESSION: 1.  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  Drop attacks.  Workup negative  PLAN: 1.  Stop Aimovig.  Start topiramate 25mg  at bedtime.  We can increase to 50mg  at bedtime in 4 weeks if needed. 2.  Follow up in 5 months.  , DO  CC: , MD

## 2020-06-01 ENCOUNTER — Ambulatory Visit: Payer: Medicare Other | Admitting: Neurology

## 2020-06-01 ENCOUNTER — Other Ambulatory Visit: Payer: Self-pay | Admitting: Neurology

## 2020-06-01 ENCOUNTER — Encounter: Payer: Self-pay | Admitting: Neurology

## 2020-06-01 ENCOUNTER — Other Ambulatory Visit: Payer: Self-pay

## 2020-06-01 VITALS — BP 126/68 | HR 62 | Ht 66.0 in | Wt 192.6 lb

## 2020-06-01 DIAGNOSIS — G43809 Other migraine, not intractable, without status migrainosus: Secondary | ICD-10-CM

## 2020-06-01 DIAGNOSIS — R55 Syncope and collapse: Secondary | ICD-10-CM

## 2020-06-01 MED ORDER — TOPIRAMATE 25 MG PO TABS: 25.0000 mg | ORAL_TABLET | Freq: Every day | ORAL | 5 refills | Status: DC

## 2020-06-01 NOTE — Patient Instructions (Signed)
1.  Stop Aimovig 2.  Start topiramate 25mg  at bedtime.  If not improved in 4 weeks, contact me and I will increase dose. 3.  Follow up in 5 months.

## 2020-06-08 ENCOUNTER — Ambulatory Visit: Payer: Medicare Other | Admitting: Neurology

## 2020-08-09 ENCOUNTER — Telehealth: Payer: Self-pay | Admitting: Neurology

## 2020-08-09 NOTE — Telephone Encounter (Signed)
Called wife and was informed that patient has been doing exercises at heart rehab and has been experiencing leg pain for the past week. Patients wife stated that patient was not able to partake in his rehab today due to the pain in his legs.   Spoke to Dr. Everlena Cooper and informed him of patients complaints. Noted were reviewed and Dr. Everlena Cooper stated that patient needs to be seen by PCP for leg pain and that it has nothing to do with Neurology. Patient should be evaluated by his PCP to make sure there isn't anything concerning going on.  Informed patients wife per Dr. Moises Blood recommendation. Patients wife verbalized understanding and will call PCP.

## 2020-09-03 IMAGING — CT CT HEAD W/O CM
3 of 4 series · 15 of 47 positions shown, 18 images · non-contrast
Comparison: 03/01/2019 brain MRI.

CLINICAL DATA: Worsening chronic ataxia and generalized weakness.
Extremity shaking. No reported injury.

EXAM:
CT HEAD WITHOUT CONTRAST
TECHNIQUE: Contiguous axial images were obtained from the base of the skull
through the vertex without intravenous contrast.

[Series 3: head 2.0 h70h · axial · 0.42mm/px · z∈[-79,+49]mm · 9 of 80 slices shown, 12 images]
[im 8/80  brain]
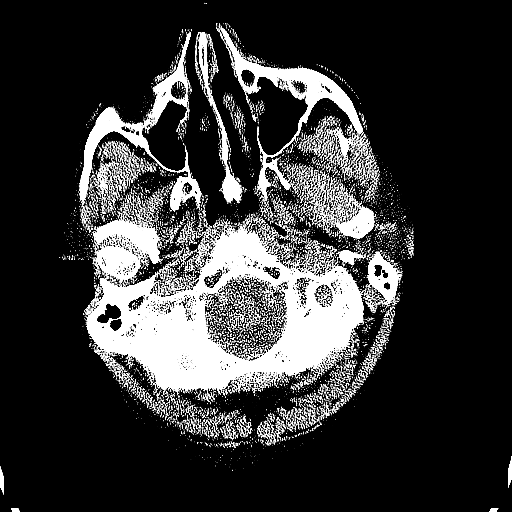
[im 8/80  bone]
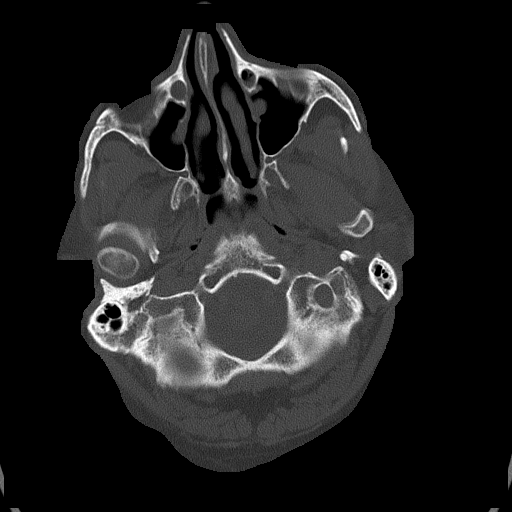
[im 16/80  brain]
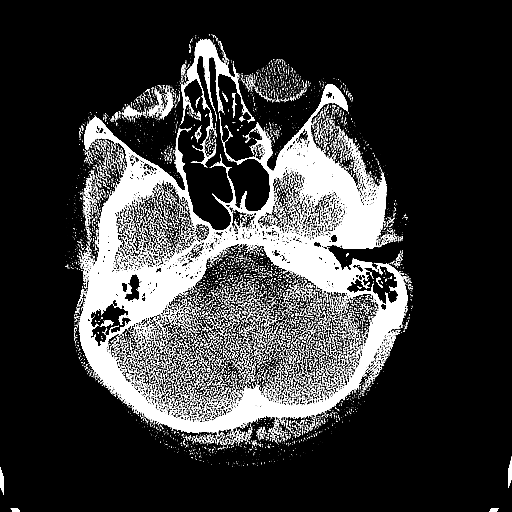
[im 24/80  brain]
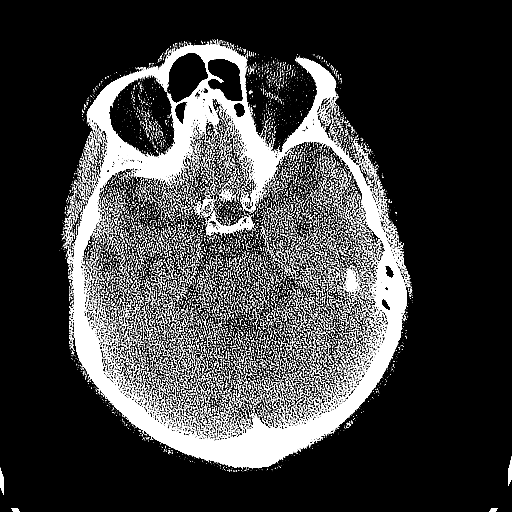
[im 32/80  brain]
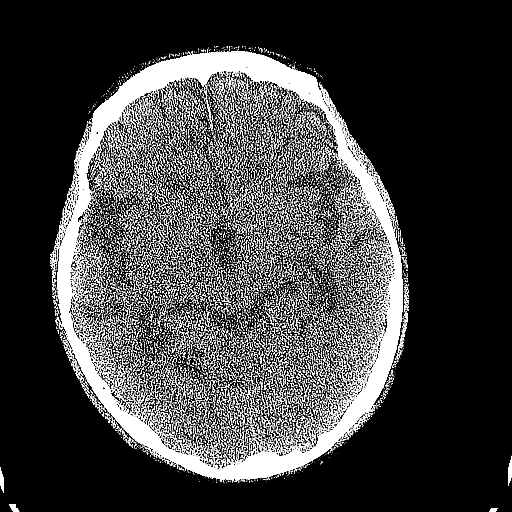
[im 40/80  brain]
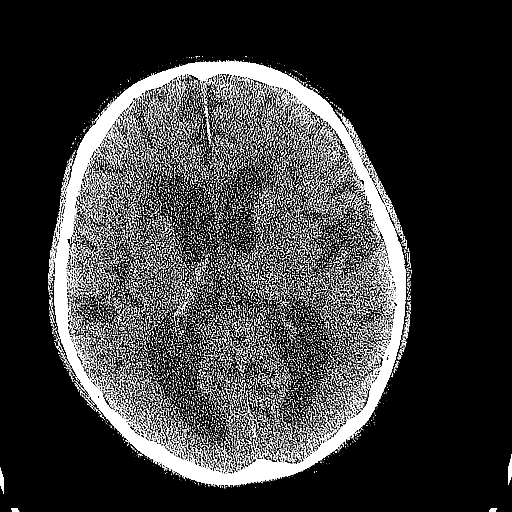
[im 40/80  bone]
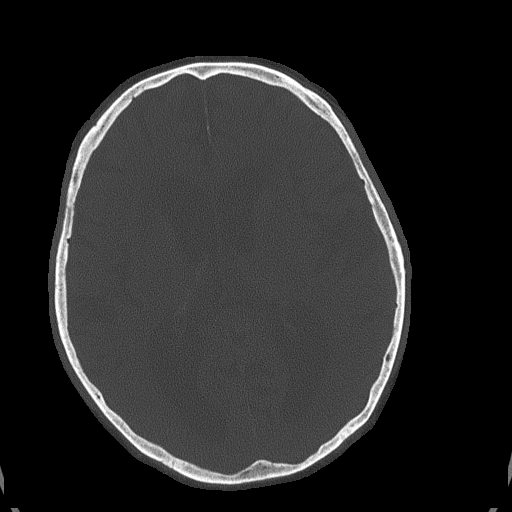
[im 48/80  brain]
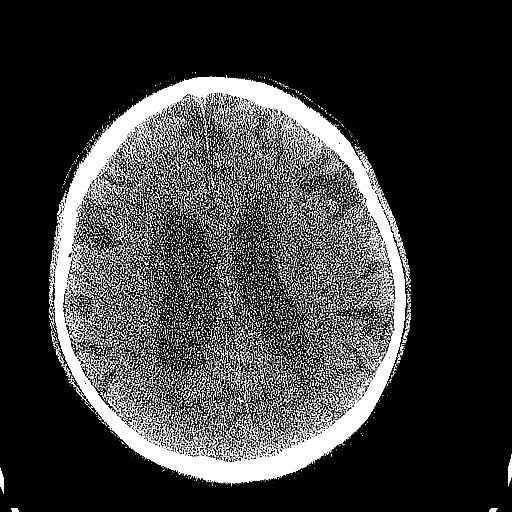
[im 56/80  brain]
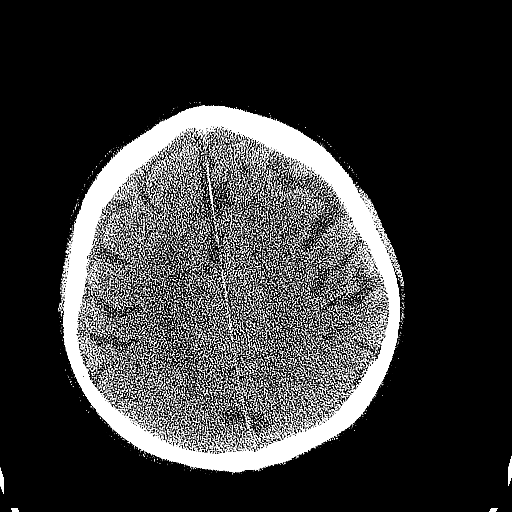
[im 64/80  brain]
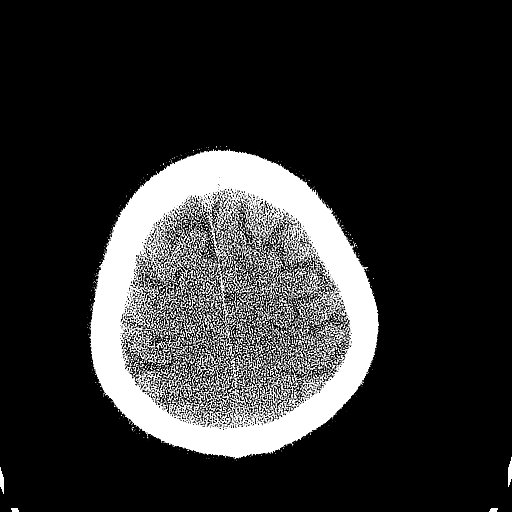
[im 72/80  brain]
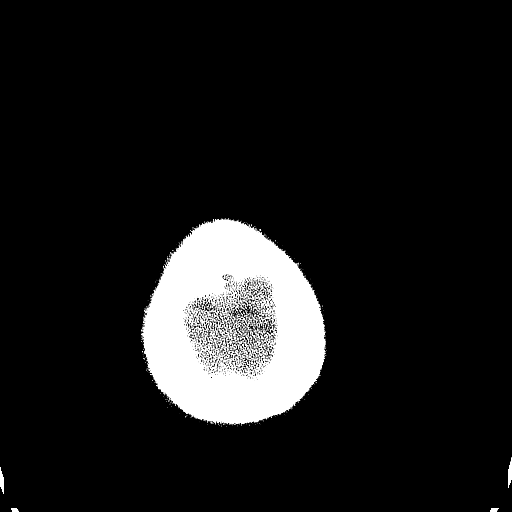
[im 72/80  bone]
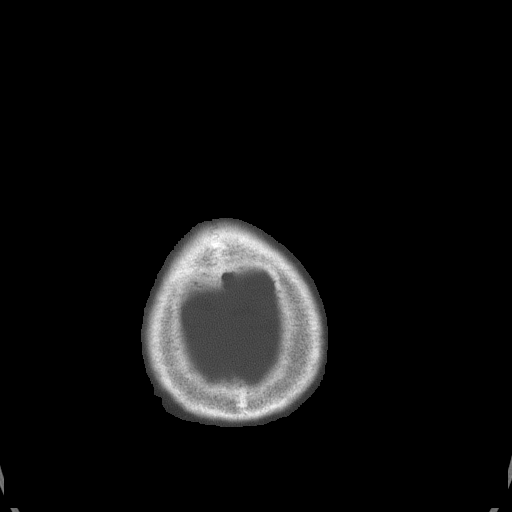

[Series 4: head 3.0 mpr cor · coronal · 0.31mm/px · 3 of 71 slices shown]
[im 24/71  brain]
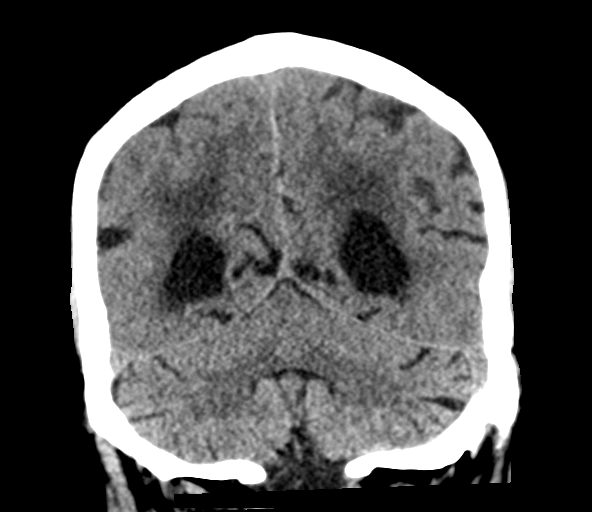
[im 32/71  brain]
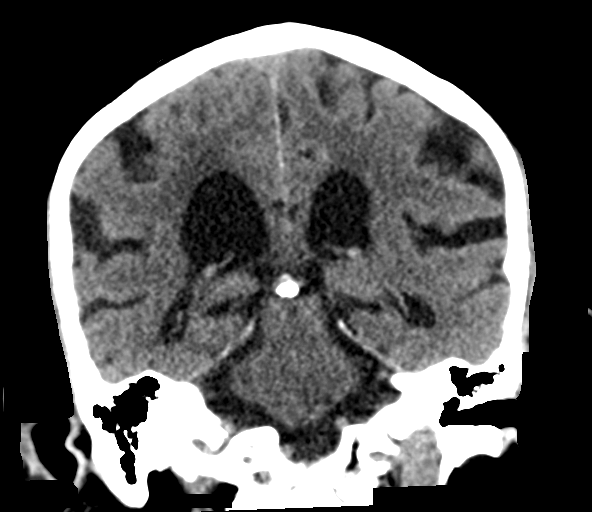
[im 39/71  brain]
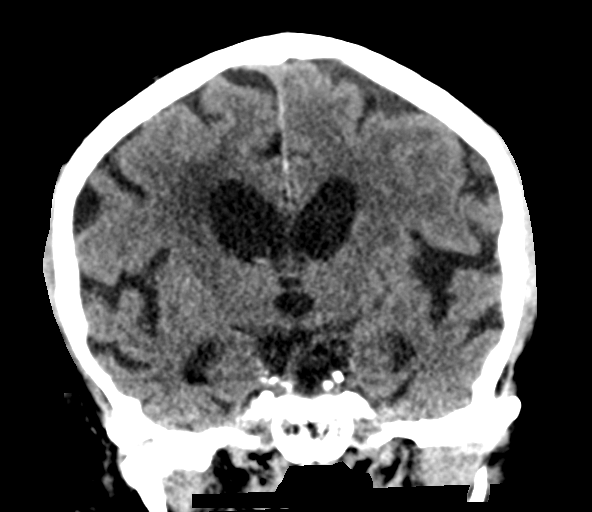

[Series 5: head 3.0 mpr sag · sagittal · 0.31mm/px · 3 of 63 slices shown]
[im 21/63  brain]
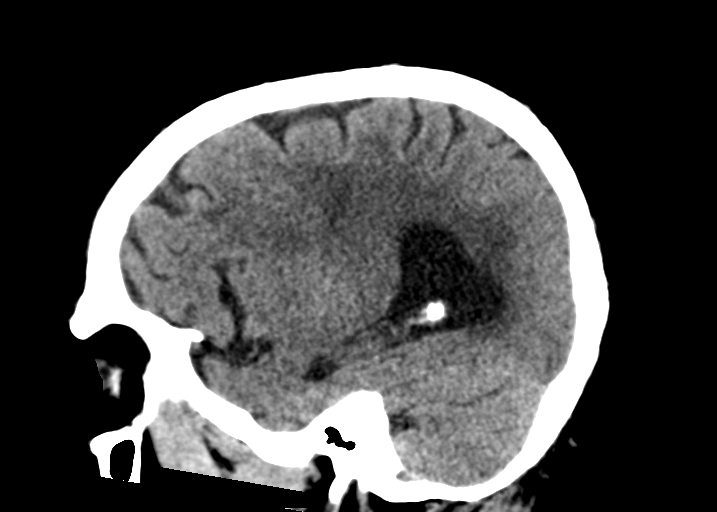
[im 32/63  brain]
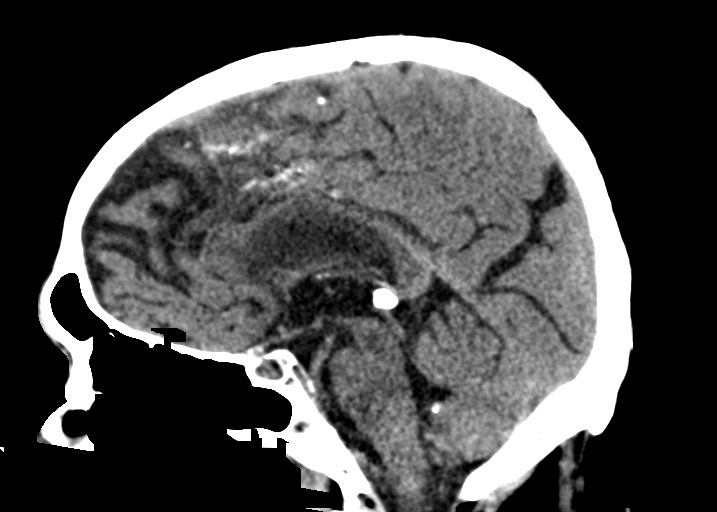
[im 42/63  brain]
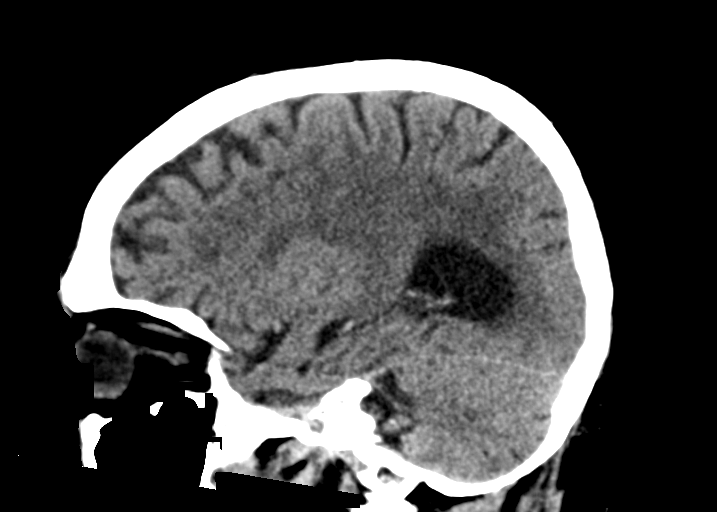

[15 of 47 positions shown; findings below may reference images not displayed]

FINDINGS: Brain: Tiny bilateral basal ganglia lacunes. No evidence of
parenchymal hemorrhage or extra-axial fluid collection. No mass
lesion, mass effect, or midline shift. No CT evidence of acute
infarction. Generalized cerebral volume loss. Nonspecific prominent
subcortical and periventricular white matter hypodensity, most in
keeping with chronic small vessel ischemic change. Cerebral
ventricle sizes are concordant with the degree of cerebral volume
loss.

Vascular: No acute abnormality.

Skull: No evidence of calvarial fracture.

Sinuses/Orbits: Mucous retention cysts versus polyps in the medial
maxillary sinuses bilaterally. No air-fluid levels. Atrophic
calcified right globe.

Other:  The mastoid air cells are unopacified.
IMPRESSION: 1.  No evidence of acute intracranial abnormality.
2. Generalized cerebral volume loss and prominent chronic small
vessel ischemic changes in cerebral white matter. Tiny bilateral
basal ganglia lacunes.

## 2020-11-14 NOTE — Progress Notes (Signed)
NEUROLOGY FOLLOW UP OFFICE NOTE  George Harvey [id number]   Subjective:  George Harvey is a 75 year old male whofollows up for vestibular migraines  UPDATE: Aimovig was switched to topiramate in August.  He also takes Cymbalta and Reglan.  He has some mild dizziness on and off but no recent severe attacks.  Balance is off.  He says he has been told that he has a "magnetic gait".  Difficult to pick up his feet.  No pain, numbness or weakness in legs.  No falls  HISTORY: He started having trouble with falls about 10 to 12 yearsago. It has been occurring off and on since, but has progressed. At first, his legs would just suddenly give out. It takes him a few minutes to stand up and when he stands up his upper body is extended back to maintain balance and resolves in 15 to 30 minutes. No loss of consciousness. He has had about 3 drop attacks this past year. He also reports balance issues that has progressed. He veers off to either side. He also reports longstanding history of dizziness or lightheadedness associated with nausea. They can last 1 to 2 days. No associated headache. They occur once a month. Rooms with high ceilings or bright lights (such as at [facility]) will make him feel dizzy. Since childhood, he has always been to movement (such as riding a carnival ride or sitting in back of car). No history of headaches. He may have intermittent double vision. He presented to the ED in September with episode of acute tremors lasting 2 hours which subsequently resolved. No recurrent episodes. He reports history of very mild tremor in his hands, particularly when holding an object.  MRI of brain without contrast from 03/01/2019 personally showed atrophy and diffuse chronic small vessel ischemic changes in the cerebral white matter as well as scattered remote lacunar infarcts, including the cerebellum.  MRI of cervical spine from 06/28/2019 showed multilevel cervical spondylosis with  severe bilateral neural foraminal stenosis at C5-6 and C6-7 with no significant spinal stenosis. MRI lumbar spine showed mild multilevel lumbar spondylosis with mild canal stenosis and moderate left and mild right lateral recess stenosis at L3-4 and L4-5.  06/20/2019 CT Head Wo: Generalized cerebral volume loss and prominent chronic small vessel ischemic changes but no acute intracranial abnormality. 08/01/2019 EEG: Normal awake and drowsy 08/31/2019 CTA of Head W Wo: Intracranial atherosclerotic disease with moderate stenosis within intracranial right vertebral artery, mild-moderate stenosis right ICA but no significant stenosis including patent basilar artery  PAST MEDICAL HISTORY: Past Medical History:  Diagnosis Date  . Hypertension     MEDICATIONS: Current Outpatient Medications on File Prior to Visit  Medication Sig Dispense Refill  . amLODipine (NORVASC) 5 MG tablet Take 5 mg by mouth daily.    Marland Kitchen buPROPion (WELLBUTRIN XL) 300 MG 24 hr tablet Take by mouth.    . Cholecalciferol (VITAMIN D3) 25 MCG (1000 UT) CAPS Take by mouth.    . clopidogrel (PLAVIX) 75 MG tablet Take 75 mg by mouth daily.    . cyanocobalamin 1000 MCG tablet Take 1,000 mcg by mouth daily.    . DULoxetine (CYMBALTA) 30 MG capsule Take 30 mg by mouth daily.    Marland Kitchen esomeprazole (NEXIUM) 40 MG capsule Take 40 mg by mouth 2 (two) times daily.    Marland Kitchen LINZESS 145 MCG CAPS capsule Take 145 mcg by mouth every morning.    Marland Kitchen lisinopril (ZESTRIL) 2.5 MG tablet Take 2.5 mg by mouth  daily.    . metoCLOPramide (REGLAN) 5 MG tablet Take by mouth.    . metoprolol succinate (TOPROL-XL) 25 MG 24 hr tablet Take 25 mg by mouth daily.    . nitroGLYCERIN (NITROSTAT) 0.4 MG SL tablet Place 0.4 mg under the tongue daily.    . pantoprazole (PROTONIX) 40 MG tablet Take 40 mg by mouth daily. (Patient not taking: Reported on 06/01/2020)    . pravastatin (PRAVACHOL) 40 MG tablet Take 40 mg by mouth at bedtime. (Patient not taking: Reported on  06/01/2020)    . ranolazine (RANEXA) 500 MG 12 hr tablet Take 500 mg by mouth 2 (two) times daily.    . rosuvastatin (CRESTOR) 20 MG tablet Take 20 mg by mouth daily.    Marland Kitchen topiramate (TOPAMAX) 25 MG tablet Take 1 tablet (25 mg total) by mouth at bedtime. 30 tablet 5   No current facility-administered medications on file prior to visit.    ALLERGIES: No Known Allergies  FAMILY HISTORY: Family History  Problem Relation Age of Onset  . Heart Problems Mother   . Heart attack Father   . Heart attack Brother        deceased  . Stroke Brother   . Heart Problems Brother   . Hypertension Child        x2  . Healthy Child        x2    SOCIAL HISTORY: Social History   Socioeconomic History  . Marital status: Married    Spouse name: Not on file  . Number of children: 4  . Years of education: Not on file  . Highest education level: 10th grade  Occupational History  . Not on file  Tobacco Use  . Smoking status: Never Smoker  . Smokeless tobacco: Never Used  Vaping Use  . Vaping Use: Never used  Substance and Sexual Activity  . Alcohol use: Never  . Drug use: Never  . Sexual activity: Not on file  Other Topics Concern  . Not on file  Social History Narrative   Pt lives with spouse in 1 story home, he has 4 children   Right handed, he does not drink coffee, tea, or soda.   He does not exercise regularly but does walk daily.   Social Determinants of Health   Financial Resource Strain: Not on file  Food Insecurity: Not on file  Transportation Needs: Not on file  Physical Activity: Not on file  Stress: Not on file  Social Connections: Not on file  Intimate Partner Violence: Not on file     Objective:  Blood pressure 117/60, pulse 63, height 5\' 6"  (1.676 m), weight 198 lb (89.8 kg), SpO2 96 %. General: No acute distress.  Patient appears well-groomed.   Head:  Normocephalic/atraumatic Eyes:  Fundi examined but not visualized Neck: supple, no paraspinal tenderness, full  range of motion Heart:  Regular rate and rhythm Lungs:  Clear to auscultation bilaterally Back: No paraspinal tenderness Neurological Exam: alert and oriented to person, place, and time. Speech fluent and not dysarthric, language intact. 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  Otherwise, CN II-XII intact. Bulk and tone normal, muscle strength 5/5 throughout.  Sensation to light touch, temperature and vibration intact.  Deep tendon reflexes 2+ throughout, toes downgoing.  Finger to nose and heel to shin testing intact. Mildy wide-based gait, Romberg negative.   Assessment/Plan:   1.  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  Drop attacks - workup negative.  I don't appreciate any real magnetic  gait on exam.  He doesn't exhibit any parkinsonism, he doesn't exhibit any ventriculomegaly on brain imaging that is out of proportion to cerebral atrophy which would suggest NPH and no significant signs of neuropathy.  May be related to his dizziness.  1.  Topiramate 25mg  at bedtime 2. Follow up 6 months.  , DO  CC: Shon Millet, MD

## 2020-11-15 ENCOUNTER — Encounter: Payer: Self-pay | Admitting: Neurology

## 2020-11-15 ENCOUNTER — Ambulatory Visit: Payer: Medicare Other | Admitting: Neurology

## 2020-11-15 ENCOUNTER — Other Ambulatory Visit: Payer: Self-pay

## 2020-11-15 VITALS — BP 117/60 | HR 63 | Ht 66.0 in | Wt 198.0 lb

## 2020-11-15 DIAGNOSIS — R55 Syncope and collapse: Secondary | ICD-10-CM | POA: Diagnosis not present

## 2020-11-15 DIAGNOSIS — G43809 Other migraine, not intractable, without status migrainosus: Secondary | ICD-10-CM | POA: Diagnosis not present

## 2020-11-15 NOTE — Patient Instructions (Signed)
Continue topiramate Follow up 6 months

## 2020-11-29 ENCOUNTER — Other Ambulatory Visit: Payer: Self-pay | Admitting: Neurology

## 2020-11-30 NOTE — Telephone Encounter (Signed)
Patient wife states that the patient needs a refill on his headache medication and was told that we would not approve it to be filled again by the pharmacy   He was seen last on 11-15-20

## 2020-11-30 NOTE — Telephone Encounter (Signed)
No sorry.

## 2021-05-24 ENCOUNTER — Other Ambulatory Visit: Payer: Self-pay | Admitting: Neurology

## 2022-02-24 DIAGNOSIS — M25811 Other specified joint disorders, right shoulder: Secondary | ICD-10-CM | POA: Insufficient documentation

## 2022-03-04 DIAGNOSIS — R9439 Abnormal result of other cardiovascular function study: Secondary | ICD-10-CM | POA: Insufficient documentation

## 2022-07-20 DIAGNOSIS — R569 Unspecified convulsions: Secondary | ICD-10-CM | POA: Insufficient documentation

## 2022-09-02 DIAGNOSIS — R42 Dizziness and giddiness: Secondary | ICD-10-CM | POA: Insufficient documentation

## 2022-10-22 ENCOUNTER — Other Ambulatory Visit: Payer: Self-pay | Admitting: Internal Medicine

## 2022-10-22 DIAGNOSIS — I2510 Atherosclerotic heart disease of native coronary artery without angina pectoris: Secondary | ICD-10-CM

## 2022-10-22 DIAGNOSIS — R0789 Other chest pain: Secondary | ICD-10-CM

## 2022-11-25 ENCOUNTER — Telehealth (HOSPITAL_COMMUNITY): Payer: Self-pay | Admitting: *Deleted

## 2022-11-25 NOTE — Telephone Encounter (Signed)
Reaching out to patient to offer assistance regarding upcoming cardiac imaging study; pt's family verbalizes understanding of appt date/time, parking situation and where to check in,  and verified current allergies; name and call back number provided for further questions should they arise  [facility] [phone] office [phone] cell  Denies metal or claustrophobia.

## 2022-11-26 ENCOUNTER — Ambulatory Visit: Admission: RE | Admit: 2022-11-26 | Payer: Medicare Other | Source: Ambulatory Visit

## 2023-01-05 DEATH — deceased

## 2023-02-02 ENCOUNTER — Other Ambulatory Visit: Payer: Self-pay

## 2023-02-02 DIAGNOSIS — R251 Tremor, unspecified: Secondary | ICD-10-CM | POA: Insufficient documentation

## 2023-02-02 DIAGNOSIS — E119 Type 2 diabetes mellitus without complications: Secondary | ICD-10-CM | POA: Insufficient documentation

## 2023-02-03 ENCOUNTER — Ambulatory Visit: Payer: Medicare Other | Admitting: Gastroenterology

## 2023-02-03 NOTE — Progress Notes (Deleted)
George Mood MD, MRCP(U.K) [address]  [address]  [place], [place] [address]  Main: [phone]  Fax: [phone]   Gastroenterology Consultation  Referring Provider:     Hollace Kinnier* Primary Care Physician:  Venetia Constable, MD Primary Gastroenterologist:  Dr. Wyline Harvey  Reason for Consultation:     GERD        HPI:   George Harvey is a 77 y.o. y/o male referred for consultation & management  by Dr. Lyn Hollingshead, Faythe Casa, MD.    He has been referred for GERD in 10/2022  08/2022: Hb 11.4 grams.   Past Medical History:  Diagnosis Date   Hypertension     Past Surgical History:  Procedure Laterality Date   CARDIAC SURGERY     stents place   CATARACT EXTRACTION     EYE SURGERY Right     Prior to Admission medications   Medication Sig Start Date End Date Taking? Authorizing Provider  alum & mag hydroxide-simeth (ALMACONE DOUBLE STRENGTH) 400-400-40 MG/5ML suspension Take 10 mLs by mouth every 6 (six) hours as needed. 04/21/20   [provider]  amLODipine (NORVASC) 5 MG tablet Take 5 mg by mouth daily. 04/06/19   [provider]  aspirin EC 81 MG tablet Take 81 mg by mouth once. 12/23/13   [provider]  buPROPion (WELLBUTRIN XL) 300 MG 24 hr tablet Take by mouth. 01/10/19   [provider]  busPIRone (BUSPAR) 5 MG tablet Take 5 mg by mouth as needed. 07/24/22   [provider]  Cholecalciferol (VITAMIN D3) 25 MCG (1000 UT) CAPS Take by mouth.    [provider]  clopidogrel (PLAVIX) 75 MG tablet Take 75 mg by mouth daily. 04/06/19   [provider]  cyanocobalamin 1000 MCG tablet Take 1,000 mcg by mouth daily.    [provider]  DULoxetine (CYMBALTA) 30 MG capsule Take 30 mg by mouth daily. 04/06/19   [provider]  Erenumab-aooe (AIMOVIG) 70 MG/ML SOAJ 70 mLs every 30 (thirty) days. 07/28/19   [provider]  esomeprazole (NEXIUM) 40 MG capsule Take 40  mg by mouth 2 (two) times daily. 05/12/20   [provider]  fexofenadine (ALLEGRA) 180 MG tablet Take 180 mg by mouth daily.    [provider]  isosorbide mononitrate (IMDUR) 120 MG 24 hr tablet Take 120 mg by mouth daily. 03/20/22 03/20/23  [provider]  levETIRAcetam (KEPPRA) 750 MG tablet Take 750 mg by mouth 2 (two) times daily. 07/18/22   [provider]  LINZESS 145 MCG CAPS capsule Take 145 mcg by mouth every morning. 01/16/20   [provider]  lisinopril (ZESTRIL) 5 MG tablet Take 5 mg by mouth daily. 05/13/22   [provider]  metoCLOPramide (REGLAN) 5 MG tablet Take by mouth.    [provider]  Metoprolol Succinate 50 MG CS24 Take 1 tablet by mouth daily.    [provider]  nitroGLYCERIN (NITROSTAT) 0.4 MG SL tablet Place 0.4 mg under the tongue daily. 02/10/20   [provider]  Omega 3 1200 MG CAPS Take by mouth.    [provider]  pantoprazole (PROTONIX) 40 MG tablet Take 40 mg by mouth daily. Patient not taking: No sig reported 06/14/19   [provider]  pravastatin (PRAVACHOL) 40 MG tablet Take 40 mg by mouth at bedtime. Patient not taking: No sig reported 05/30/19   [provider]  ranolazine (RANEXA) 500 MG  12 hr tablet Take 500 mg by mouth 2 (two) times daily. 05/10/20   [provider]  rosuvastatin (CRESTOR) 20 MG tablet Take 20 mg by mouth daily. 04/06/20   [provider]  topiramate (TOPAMAX) 25 MG tablet TAKE 1 TABLET(25 MG) BY MOUTH AT BEDTIME 11/30/20   Drema Dallas, DO    Family History  Problem Relation Age of Onset   Heart Problems Mother    Heart attack Father    Heart attack Brother        deceased   Stroke Brother    Heart Problems Brother    Hypertension Child        x2   Healthy Child        x2     Social History   Tobacco Use   Smoking status: Never   Smokeless tobacco: Never  Vaping Use   Vaping Use: Never used  Substance  Use Topics   Alcohol use: Never   Drug use: Never    Allergies as of 02/03/2023   (No Known Allergies)    Review of Systems:    All systems reviewed and negative except where noted in HPI.   Physical Exam:  There were no vitals taken for this visit. No LMP for male patient. Psych:  Alert and cooperative. Normal Harvey and affect. General:   Alert,  Well-developed, well-nourished, pleasant and cooperative in NAD Head:  Normocephalic and atraumatic. Eyes:  Sclera clear, no icterus.   Conjunctiva pink. Ears:  Normal auditory acuity.  Neurologic:  Alert and oriented x3;  grossly normal neurologically. Psych:  Alert and cooperative. Normal Harvey and affect.  Imaging Studies: No results found.  Assessment and Plan:   George Harvey is a 77 y.o. y/o male has been referred for GERD  Follow up in ***  Dr George Mood MD,MRCP(U.K)    BP check ***
